# Patient Record
Sex: Female | Born: 1940 | Race: Black or African American | Hispanic: No
Health system: Southern US, Community
[De-identification: ages and names within clinical notes are randomized; demographics above are authoritative.]

---

## 2009-05-27 ENCOUNTER — Ambulatory Visit: Payer: Self-pay | Admitting: Family Medicine

## 2009-06-20 ENCOUNTER — Ambulatory Visit: Payer: Self-pay | Admitting: Anesthesiology

## 2009-06-24 ENCOUNTER — Ambulatory Visit: Payer: Self-pay | Admitting: Surgery

## 2009-07-07 ENCOUNTER — Ambulatory Visit: Payer: Self-pay | Admitting: Internal Medicine

## 2009-07-09 ENCOUNTER — Ambulatory Visit: Payer: Self-pay | Admitting: Internal Medicine

## 2009-07-25 ENCOUNTER — Ambulatory Visit: Payer: Self-pay | Admitting: Internal Medicine

## 2009-08-04 ENCOUNTER — Ambulatory Visit: Payer: Self-pay | Admitting: Surgery

## 2009-08-11 ENCOUNTER — Ambulatory Visit: Payer: Self-pay | Admitting: Internal Medicine

## 2009-08-25 ENCOUNTER — Ambulatory Visit: Payer: Self-pay | Admitting: Internal Medicine

## 2009-09-01 ENCOUNTER — Ambulatory Visit: Payer: Self-pay | Admitting: Internal Medicine

## 2009-09-25 ENCOUNTER — Ambulatory Visit: Payer: Self-pay | Admitting: Internal Medicine

## 2009-10-25 ENCOUNTER — Ambulatory Visit: Payer: Self-pay | Admitting: Internal Medicine

## 2009-11-25 ENCOUNTER — Ambulatory Visit: Payer: Self-pay | Admitting: Internal Medicine

## 2009-12-25 ENCOUNTER — Ambulatory Visit: Payer: Self-pay | Admitting: Internal Medicine

## 2010-01-22 LAB — CANCER ANTIGEN 27.29: CA 27.29: 19.6 U/mL (ref 0.0–38.6)

## 2010-01-25 ENCOUNTER — Ambulatory Visit: Payer: Self-pay | Admitting: Internal Medicine

## 2010-02-03 LAB — CANCER ANTIGEN 27.29: CA 27.29: 22.4 U/mL (ref 0.0–38.6)

## 2010-02-25 ENCOUNTER — Ambulatory Visit: Payer: Self-pay | Admitting: Internal Medicine

## 2010-03-26 ENCOUNTER — Ambulatory Visit: Payer: Self-pay | Admitting: Internal Medicine

## 2010-04-28 ENCOUNTER — Ambulatory Visit: Payer: Self-pay | Admitting: Internal Medicine

## 2010-05-26 ENCOUNTER — Ambulatory Visit: Payer: Self-pay | Admitting: Internal Medicine

## 2010-06-09 LAB — CANCER ANTIGEN 27.29: CA 27.29: 19.4 U/mL (ref 0.0–38.6)

## 2010-06-26 ENCOUNTER — Ambulatory Visit: Payer: Self-pay | Admitting: Internal Medicine

## 2010-07-26 ENCOUNTER — Ambulatory Visit: Payer: Self-pay | Admitting: Internal Medicine

## 2010-08-26 ENCOUNTER — Ambulatory Visit: Payer: Self-pay | Admitting: Internal Medicine

## 2010-09-26 ENCOUNTER — Ambulatory Visit: Payer: Self-pay | Admitting: Internal Medicine

## 2010-10-13 LAB — CANCER ANTIGEN 27.29: CA 27.29: 15.1 U/mL (ref 0.0–38.6)

## 2010-10-26 ENCOUNTER — Ambulatory Visit: Payer: Self-pay | Admitting: Internal Medicine

## 2010-11-26 ENCOUNTER — Ambulatory Visit: Payer: Self-pay | Admitting: Internal Medicine

## 2010-12-26 ENCOUNTER — Ambulatory Visit: Payer: Self-pay | Admitting: Internal Medicine

## 2011-01-26 ENCOUNTER — Ambulatory Visit: Payer: Self-pay | Admitting: Internal Medicine

## 2011-02-26 ENCOUNTER — Ambulatory Visit: Payer: Self-pay | Admitting: Internal Medicine

## 2011-04-14 ENCOUNTER — Ambulatory Visit: Payer: Self-pay | Admitting: Oncology

## 2011-04-14 LAB — CBC CANCER CENTER
Basophil #: 0 x10 3/mm (ref 0.0–0.1)
Eosinophil #: 0.2 x10 3/mm (ref 0.0–0.7)
HCT: 37 % (ref 35.0–47.0)
HGB: 12.1 g/dL (ref 12.0–16.0)
Lymphocyte %: 15.4 %
MCH: 28.2 pg (ref 26.0–34.0)
MCHC: 32.8 g/dL (ref 32.0–36.0)
Monocyte #: 0.4 x10 3/mm (ref 0.0–0.7)
Neutrophil #: 5 x10 3/mm (ref 1.4–6.5)
Neutrophil %: 76 %
Platelet: 246 x10 3/mm (ref 150–440)
WBC: 6.6 x10 3/mm (ref 3.6–11.0)

## 2011-04-14 LAB — COMPREHENSIVE METABOLIC PANEL
Alkaline Phosphatase: 41 U/L — ABNORMAL LOW (ref 50–136)
BUN: 14 mg/dL (ref 7–18)
Bilirubin,Total: 0.4 mg/dL (ref 0.2–1.0)
Calcium, Total: 9.4 mg/dL (ref 8.5–10.1)
Chloride: 103 mmol/L (ref 98–107)
Co2: 31 mmol/L (ref 21–32)
Creatinine: 1.22 mg/dL (ref 0.60–1.30)
EGFR (African American): 56 — ABNORMAL LOW
EGFR (Non-African Amer.): 47 — ABNORMAL LOW
Osmolality: 285 (ref 275–301)
SGPT (ALT): 26 U/L

## 2011-04-15 LAB — CANCER ANTIGEN 27.29: CA 27.29: 15.8 U/mL (ref 0.0–38.6)

## 2011-04-26 ENCOUNTER — Ambulatory Visit: Payer: Self-pay | Admitting: Oncology

## 2011-05-26 ENCOUNTER — Ambulatory Visit: Payer: Self-pay | Admitting: Oncology

## 2011-06-26 ENCOUNTER — Ambulatory Visit: Payer: Self-pay | Admitting: Oncology

## 2011-07-26 ENCOUNTER — Ambulatory Visit: Payer: Self-pay | Admitting: Oncology

## 2011-07-28 ENCOUNTER — Ambulatory Visit: Payer: Self-pay | Admitting: Surgery

## 2011-07-28 LAB — BASIC METABOLIC PANEL
Anion Gap: 8 (ref 7–16)
BUN: 17 mg/dL (ref 7–18)
Calcium, Total: 9.2 mg/dL (ref 8.5–10.1)
Chloride: 103 mmol/L (ref 98–107)
Creatinine: 1.33 mg/dL — ABNORMAL HIGH (ref 0.60–1.30)
EGFR (Non-African Amer.): 41 — ABNORMAL LOW
Glucose: 149 mg/dL — ABNORMAL HIGH (ref 65–99)
Osmolality: 280 (ref 275–301)

## 2011-07-29 ENCOUNTER — Ambulatory Visit: Payer: Self-pay | Admitting: Oncology

## 2011-08-04 ENCOUNTER — Ambulatory Visit: Payer: Self-pay | Admitting: Surgery

## 2011-08-13 IMAGING — CT NM PET TUM IMG RESTAG (PS) SKULL BASE T - THIGH
5 series · 25 of 25 positions shown · non-contrast
Comparison: none

REASON FOR EXAM: Restaging Left Breast Hx Lt Breast Recurrent Lt Axilla
COMMENTS:

PROCEDURE:     PET - PET/CT RESTG BREAST CA  - July 09, 2009  [DATE]
RESULT:
HISTORY: Breast cancer.

[Series 3: ct wb 3.0 b30f · axial · 3.0mm · 0.98mm/px · z∈[-1270,-520]mm · 10 of 376 slices shown]
[im 1/376]
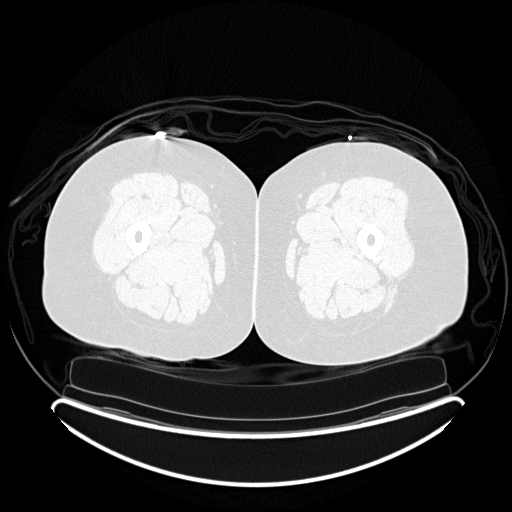
[im 42/376]
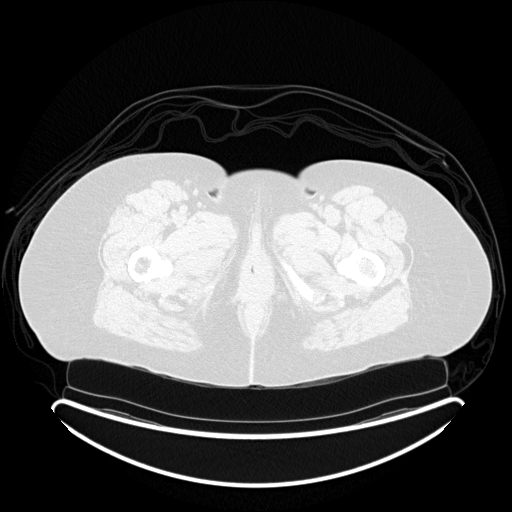
[im 84/376]
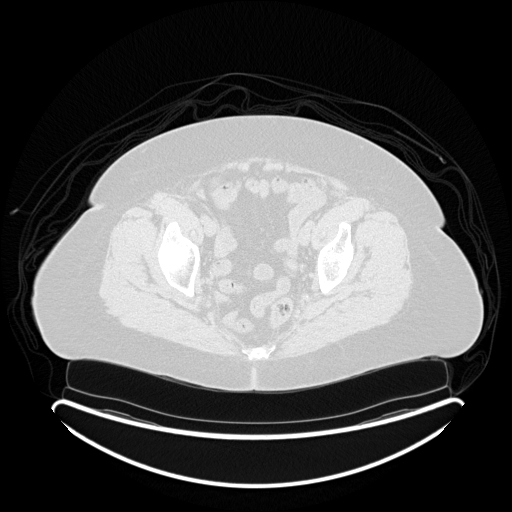
[im 126/376]
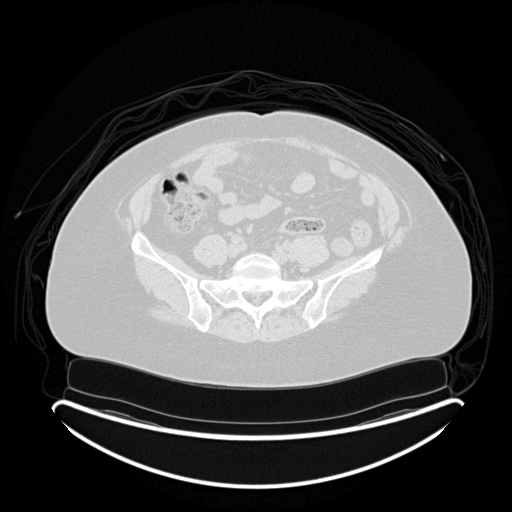
[im 167/376]
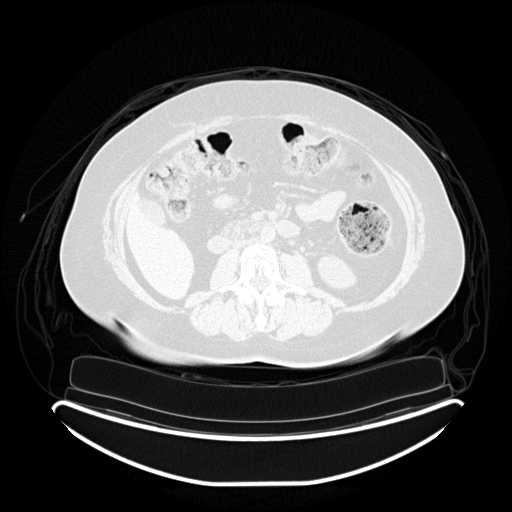
[im 209/376]
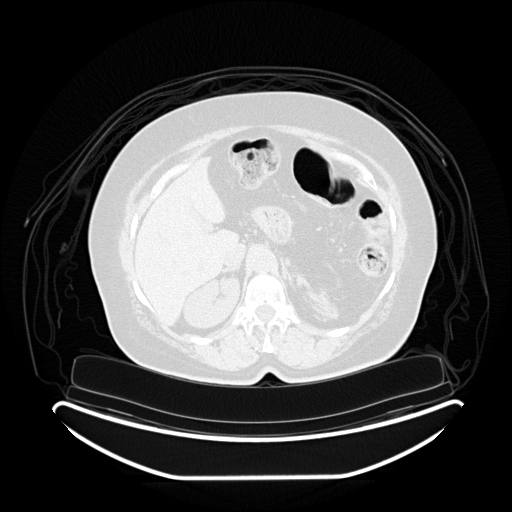
[im 251/376]
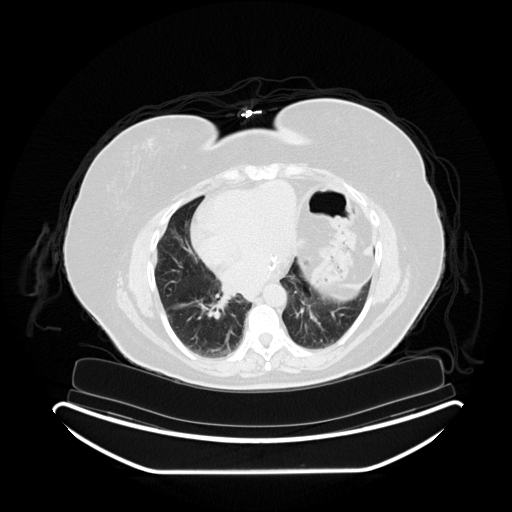
[im 292/376]
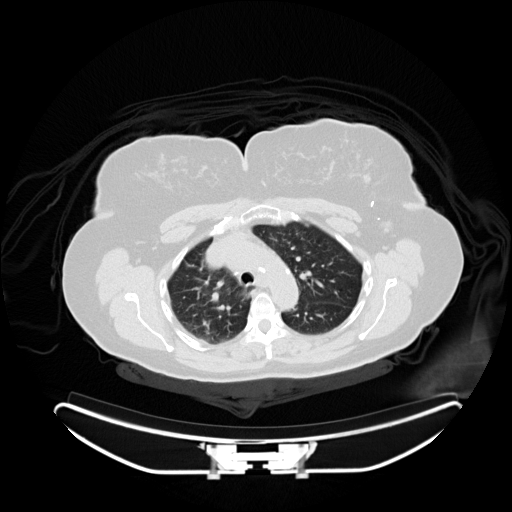
[im 334/376]
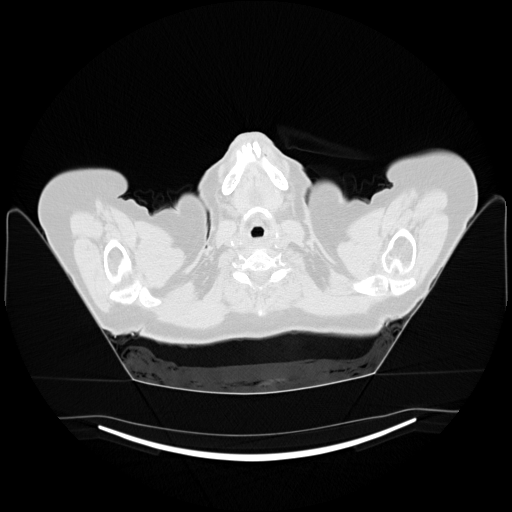
[im 376/376]
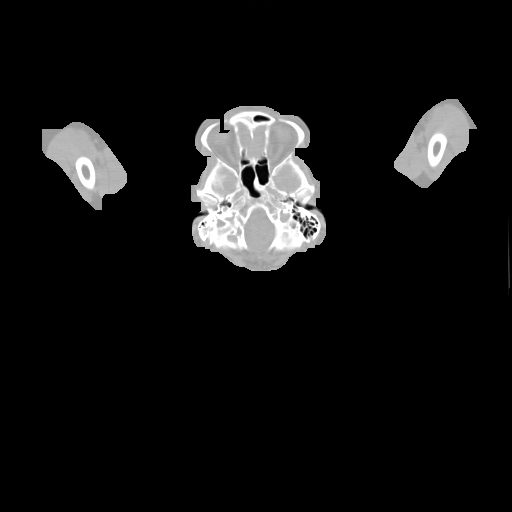

[Series 102: pet wb · axial · 5.0mm · 4.07mm/px · z∈[-1270,-520]mm · 7 of 251 slices shown]
[im 1/251]
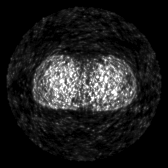
[im 42/251]
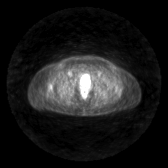
[im 84/251]
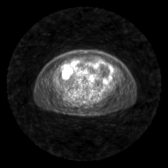
[im 126/251]
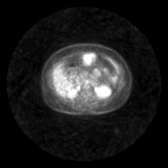
[im 167/251]
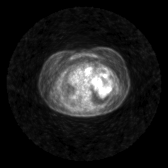
[im 209/251]
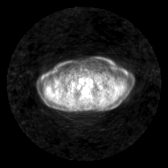
[im 251/251]
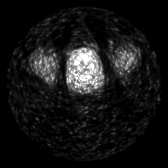

[Series 603: pet axial · 4 of 145 slices shown]
[im 1/145]
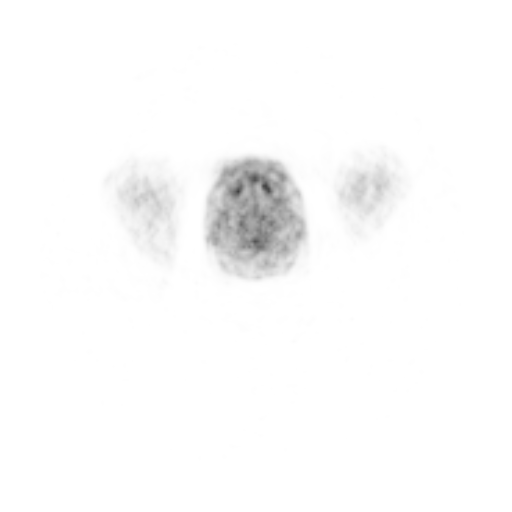
[im 49/145]
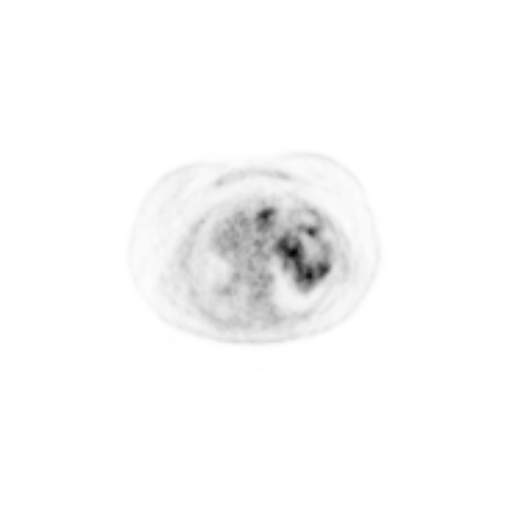
[im 97/145]
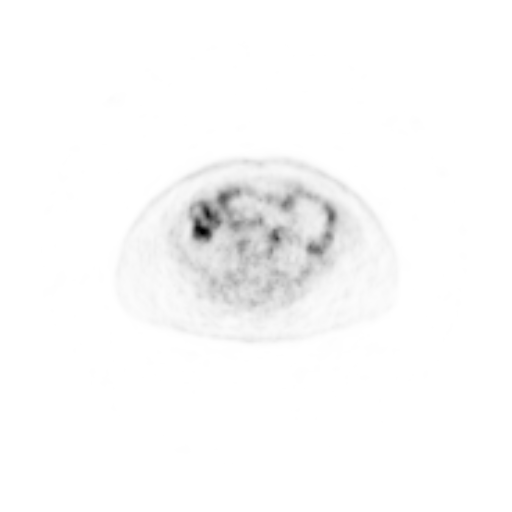
[im 145/145]
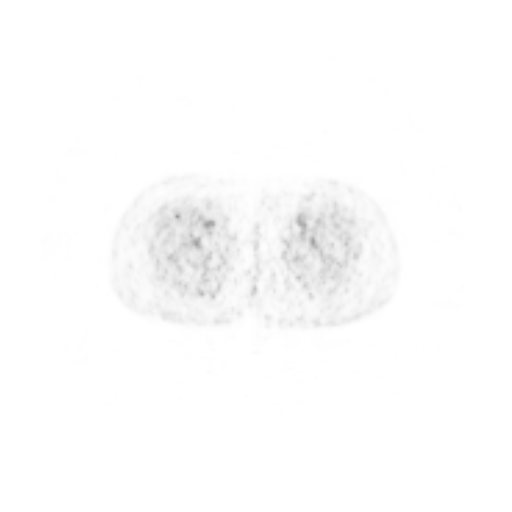

[Series 604: pet coronal · 2 of 57 slices shown]
[im 1/57]
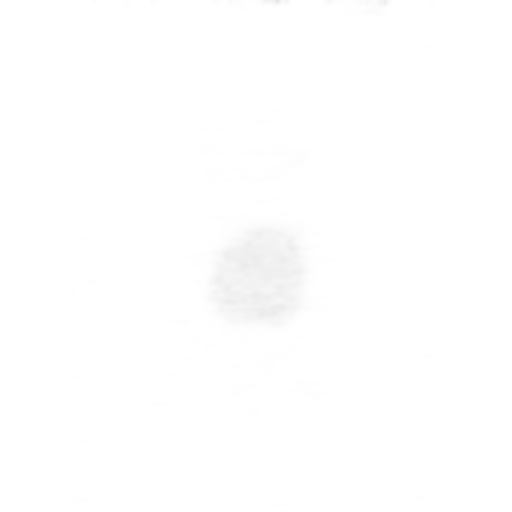
[im 57/57]
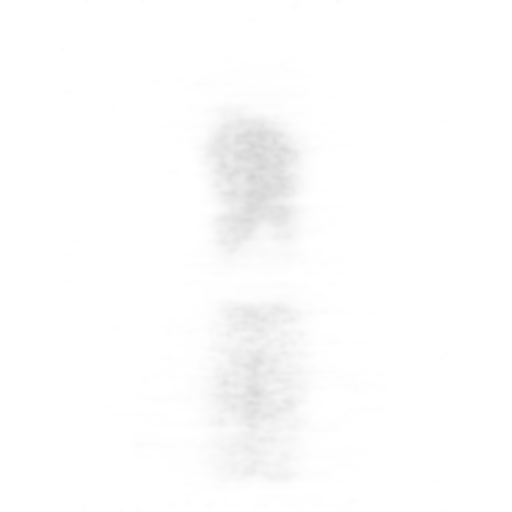

[Series 605: pet sagittal · 2 of 84 slices shown]
[im 1/84]
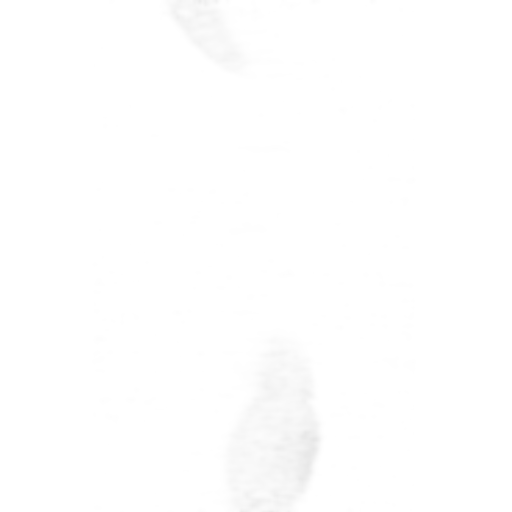
[im 84/84]
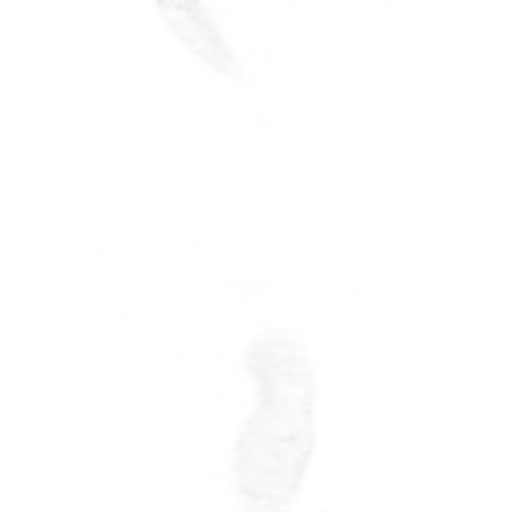

[25 of 25 positions shown; findings below may reference images not displayed]

PROCEDURE AND FINDINGS: Following determination of a fasting blood sugar of
114 mg/dl, 13.68 mCi of F-18 FDG was administered to the patient and PET CT
obtained. CT was obtained for attenuation, correction and fusion. No PET
positive abnormalities are identified. There is a left diaphragmatic hernia
versus eventration. Coronary artery disease is present.
IMPRESSION: 1. No evidence of PET positive abnormality.

## 2011-08-26 ENCOUNTER — Ambulatory Visit: Payer: Self-pay | Admitting: Oncology

## 2011-10-20 ENCOUNTER — Ambulatory Visit: Payer: Self-pay | Admitting: Oncology

## 2011-10-26 ENCOUNTER — Ambulatory Visit: Payer: Self-pay | Admitting: Oncology

## 2012-07-26 ENCOUNTER — Ambulatory Visit: Payer: Self-pay | Admitting: Oncology

## 2014-05-19 NOTE — Op Note (Signed)
PATIENT NAME:  Westley ChandlerJONES, Rashika E MR#:  [id number] DATE OF BIRTH:  [birth year]  DATE OF PROCEDURE:  08/04/2011  PREOPERATIVE DIAGNOSIS: Breast carcinoma.   POSTOPERATIVE DIAGNOSIS: Breast carcinoma.   OPERATION: Port-A-Cath removal.   SURGEON: Quentin Orealph L. Ely, III, MD    ANESTHESIA: MAC.   OPERATIVE PROCEDURE: With the patient in the supine position after induction of appropriate intravenous sedation, the patient's right chest was prepped with ChloraPrep and draped with sterile towels. Once percent Xylocaine buffered with sodium bicarbonate was injected over the site of the previous incision. The incision was carried down through the subcutaneous tissue with Bovie electrocautery entering the Port-A-Cath capsule. The capsule was dissected free allowing exposure to the device. The device elevated into the incision. The sutures were removed. The catheter was removed with the Port-A-Cath device and the plastic clip. There did not appear to be any evidence of retained foreign body. The area was irrigated. The subcutaneous space was obliterated with 3-0 Vicryl, and the skin was closed with a running subcuticular suture of 4-0 Vicryl. Benzoin and Steri-Strips were applied. The wounds were dressed sterilely and the patient was returned to the recovery room having tolerated the procedure well. Sponge, instrument, and needle counts were correct x2 in the Operating Room.  ____________________________ Carmie Endalph L. Ely III, MD rle:cbb D: 08/04/2011 07:53:31 ET T: 08/04/2011 09:41:54 ET JOB#: [id number]  cc: Carmie Endalph L. Ely III, MD, <Dictator> Jorje GuildGlenn R. Beckey DowningWillett, MD Quentin OreALPH L ELY MD ELECTRONICALLY SIGNED 08/10/2011 7:52
